# Patient Record
Sex: Female | Born: 1945 | Race: Black or African American | Hispanic: No | Marital: Single | Smoking: Never smoker
Health system: Southern US, Community
[De-identification: ages and names within clinical notes are randomized; demographics above are authoritative.]

## PROBLEM LIST (undated history)

## (undated) DIAGNOSIS — E119 Type 2 diabetes mellitus without complications: Secondary | ICD-10-CM

## (undated) DIAGNOSIS — E7800 Pure hypercholesterolemia, unspecified: Secondary | ICD-10-CM

## (undated) DIAGNOSIS — I10 Essential (primary) hypertension: Secondary | ICD-10-CM

## (undated) HISTORY — DX: Pure hypercholesterolemia, unspecified: E78.00

## (undated) HISTORY — DX: Type 2 diabetes mellitus without complications: E11.9

## (undated) HISTORY — DX: Essential (primary) hypertension: I10

---

## 2008-02-28 ENCOUNTER — Other Ambulatory Visit: Admission: RE | Admit: 2008-02-28 | Discharge: 2008-02-28 | Payer: Self-pay | Admitting: Family Medicine

## 2010-09-08 ENCOUNTER — Other Ambulatory Visit
Admission: RE | Admit: 2010-09-08 | Discharge: 2010-09-08 | Payer: Self-pay | Source: Home / Self Care | Admitting: Family Medicine

## 2011-03-02 ENCOUNTER — Emergency Department (HOSPITAL_COMMUNITY)
Admission: EM | Admit: 2011-03-02 | Discharge: 2011-03-02 | Disposition: A | Discharge status: Home / Self Care | Payer: BC Managed Care – PPO | Attending: Emergency Medicine | Admitting: Emergency Medicine

## 2011-03-02 ENCOUNTER — Emergency Department (HOSPITAL_COMMUNITY): Payer: Self-pay

## 2011-03-02 DIAGNOSIS — J189 Pneumonia, unspecified organism: Secondary | ICD-10-CM | POA: Insufficient documentation

## 2011-03-02 DIAGNOSIS — E7800 Pure hypercholesterolemia, unspecified: Secondary | ICD-10-CM | POA: Insufficient documentation

## 2011-03-02 DIAGNOSIS — R05 Cough: Secondary | ICD-10-CM | POA: Insufficient documentation

## 2011-03-02 DIAGNOSIS — I10 Essential (primary) hypertension: Secondary | ICD-10-CM | POA: Insufficient documentation

## 2011-03-02 DIAGNOSIS — Z79899 Other long term (current) drug therapy: Secondary | ICD-10-CM | POA: Insufficient documentation

## 2011-03-02 DIAGNOSIS — R059 Cough, unspecified: Secondary | ICD-10-CM | POA: Insufficient documentation

## 2011-03-02 DIAGNOSIS — IMO0001 Reserved for inherently not codable concepts without codable children: Secondary | ICD-10-CM | POA: Insufficient documentation

## 2011-09-12 ENCOUNTER — Other Ambulatory Visit (HOSPITAL_COMMUNITY)
Admission: RE | Admit: 2011-09-12 | Discharge: 2011-09-12 | Disposition: A | Discharge status: Home / Self Care | Payer: 59 | Source: Ambulatory Visit | Attending: Family Medicine | Admitting: Family Medicine

## 2011-09-12 DIAGNOSIS — Z124 Encounter for screening for malignant neoplasm of cervix: Secondary | ICD-10-CM | POA: Insufficient documentation

## 2013-11-05 ENCOUNTER — Other Ambulatory Visit: Payer: Self-pay | Admitting: Family Medicine

## 2013-11-05 ENCOUNTER — Other Ambulatory Visit (HOSPITAL_COMMUNITY)
Admission: RE | Admit: 2013-11-05 | Discharge: 2013-11-05 | Disposition: A | Discharge status: Home / Self Care | Payer: 59 | Source: Ambulatory Visit | Attending: Family Medicine | Admitting: Family Medicine

## 2013-11-05 DIAGNOSIS — Z124 Encounter for screening for malignant neoplasm of cervix: Secondary | ICD-10-CM | POA: Insufficient documentation

## 2014-11-10 ENCOUNTER — Other Ambulatory Visit (HOSPITAL_COMMUNITY)
Admission: RE | Admit: 2014-11-10 | Discharge: 2014-11-10 | Disposition: A | Discharge status: Home / Self Care | Payer: 59 | Source: Ambulatory Visit | Attending: Family Medicine | Admitting: Family Medicine

## 2014-11-10 ENCOUNTER — Other Ambulatory Visit: Payer: Self-pay | Admitting: Family Medicine

## 2014-11-10 DIAGNOSIS — Z124 Encounter for screening for malignant neoplasm of cervix: Secondary | ICD-10-CM | POA: Insufficient documentation

## 2014-11-12 LAB — CYTOLOGY - PAP

## 2014-11-17 ENCOUNTER — Other Ambulatory Visit: Payer: Self-pay | Admitting: Family Medicine

## 2014-11-17 DIAGNOSIS — Z1231 Encounter for screening mammogram for malignant neoplasm of breast: Secondary | ICD-10-CM

## 2014-11-17 DIAGNOSIS — E2839 Other primary ovarian failure: Secondary | ICD-10-CM

## 2014-11-25 ENCOUNTER — Ambulatory Visit
Admission: RE | Admit: 2014-11-25 | Discharge: 2014-11-25 | Disposition: A | Discharge status: Home / Self Care | Payer: 59 | Source: Ambulatory Visit | Attending: Family Medicine | Admitting: Family Medicine

## 2014-11-25 DIAGNOSIS — Z1231 Encounter for screening mammogram for malignant neoplasm of breast: Secondary | ICD-10-CM

## 2017-08-04 ENCOUNTER — Encounter: Payer: Self-pay | Admitting: Family Medicine

## 2017-11-02 ENCOUNTER — Encounter: Payer: Self-pay | Admitting: Family Medicine

## 2018-03-20 ENCOUNTER — Encounter: Payer: Self-pay | Admitting: Family Medicine

## 2018-03-20 DIAGNOSIS — F320 Major depressive disorder, single episode, mild: Secondary | ICD-10-CM | POA: Diagnosis not present

## 2018-03-20 DIAGNOSIS — Z1211 Encounter for screening for malignant neoplasm of colon: Secondary | ICD-10-CM | POA: Diagnosis not present

## 2018-03-20 DIAGNOSIS — E1122 Type 2 diabetes mellitus with diabetic chronic kidney disease: Secondary | ICD-10-CM | POA: Diagnosis not present

## 2018-03-20 DIAGNOSIS — N183 Chronic kidney disease, stage 3 (moderate): Secondary | ICD-10-CM | POA: Diagnosis not present

## 2018-03-20 DIAGNOSIS — Z0000 Encounter for general adult medical examination without abnormal findings: Secondary | ICD-10-CM | POA: Diagnosis not present

## 2018-03-20 DIAGNOSIS — G4700 Insomnia, unspecified: Secondary | ICD-10-CM | POA: Diagnosis not present

## 2018-03-20 DIAGNOSIS — E559 Vitamin D deficiency, unspecified: Secondary | ICD-10-CM | POA: Diagnosis not present

## 2018-05-24 DIAGNOSIS — N183 Chronic kidney disease, stage 3 (moderate): Secondary | ICD-10-CM | POA: Diagnosis not present

## 2018-05-24 DIAGNOSIS — I129 Hypertensive chronic kidney disease with stage 1 through stage 4 chronic kidney disease, or unspecified chronic kidney disease: Secondary | ICD-10-CM | POA: Diagnosis not present

## 2018-05-24 DIAGNOSIS — G479 Sleep disorder, unspecified: Secondary | ICD-10-CM | POA: Diagnosis not present

## 2018-05-24 DIAGNOSIS — E1122 Type 2 diabetes mellitus with diabetic chronic kidney disease: Secondary | ICD-10-CM | POA: Diagnosis not present

## 2018-05-24 DIAGNOSIS — M25511 Pain in right shoulder: Secondary | ICD-10-CM | POA: Diagnosis not present

## 2018-05-24 DIAGNOSIS — E559 Vitamin D deficiency, unspecified: Secondary | ICD-10-CM | POA: Diagnosis not present

## 2018-05-24 DIAGNOSIS — R42 Dizziness and giddiness: Secondary | ICD-10-CM | POA: Diagnosis not present

## 2018-05-24 DIAGNOSIS — Z23 Encounter for immunization: Secondary | ICD-10-CM | POA: Diagnosis not present

## 2018-06-13 ENCOUNTER — Encounter: Payer: Medicare HMO | Attending: Family Medicine | Admitting: *Deleted

## 2018-06-13 DIAGNOSIS — IMO0002 Reserved for concepts with insufficient information to code with codable children: Secondary | ICD-10-CM

## 2018-06-13 DIAGNOSIS — Z713 Dietary counseling and surveillance: Secondary | ICD-10-CM | POA: Insufficient documentation

## 2018-06-13 DIAGNOSIS — E118 Type 2 diabetes mellitus with unspecified complications: Secondary | ICD-10-CM

## 2018-06-13 DIAGNOSIS — E1165 Type 2 diabetes mellitus with hyperglycemia: Secondary | ICD-10-CM

## 2018-06-13 DIAGNOSIS — E1122 Type 2 diabetes mellitus with diabetic chronic kidney disease: Secondary | ICD-10-CM | POA: Diagnosis not present

## 2018-06-13 NOTE — Patient Instructions (Signed)
Plan:  Aim for 3 Carb Choices per meal (45 grams) +/- 1 either way  Aim for 0-2 Carbs per snack if hungry  Include protein in moderation with your meals and snacks Consider reading food labels for Total Carbohydrate of foods Consider  increasing your activity level by walking for 10-15 minutes daily as tolerated Consider checking BG at alternate times per day especially after exercise to see it's effect on your BG Constinue taking medication as directed by MD

## 2018-06-14 NOTE — Progress Notes (Signed)
Diabetes Self-Management Education  Visit Type: First/Initial  Appt. Start Time: 1530 Appt. End Time: 1700  06/14/2018  [name], identified by name and date of birth, is a 72 y.o. female with a diagnosis of Diabetes: Type 2. 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 She states she lives with her sister and brother, her brother does most of the cooking for the family. She gets exercise walking some of her patients on certain days of the week. She expresses frustration that she is often told what NOT to eat, with little suggestion of what she should eat.   ASSESSMENT  Height 5\' 2"  (1.575 m), weight 203 lb 8 oz (92.3 kg). Body mass index is 37.22 kg/m.  Diabetes Self-Management Education - 06/13/18 1547      Dietary Intake   Breakfast  McMuffin with sausage egg and cheese OR bagel with cream cheese or butter OR bacon and eggs at home and banana (with most breakfast meals)  (Pended)     Snack (morning)  PNB cracker  (Pended)     Lunch  bananan nut muffin if working OR hot meal if working where they have a cafe; chicken and vegetables  (Pended)     Snack (afternoon)  PNB sandwich  (Pended)     Dinner  picks up chicken with bread on way home from work or family cooks   (Pended)     Snack (evening)  PNB sandwich OR fruit cup OR applesauce  (Pended)     Beverage(s)  1/2 and 1/2 tea, hot tea,   (Pended)        Individualized Plan for Diabetes Self-Management Training:   Learning Objective:  Patient will have a greater understanding of diabetes self-management. Patient education plan is to attend individual and/or group sessions per assessed needs and concerns.   Plan:   Patient Instructions  Plan:  Aim for 3 Carb Choices per meal (45 grams) +/- 1 either way  Aim for 0-2 Carbs per snack if hungry  Include protein in moderation with your meals and snacks Consider reading food  labels for Total Carbohydrate of foods Consider  increasing your activity level by walking for 10-15 minutes daily as tolerated Consider checking BG at alternate times per day especially after exercise to see it's effect on your BG Constinue taking medication as directed by MD  Expected Outcomes:  Demonstrated interest in learning. Expect positive outcomes  Education material provided: Food label handouts, A1C conversion sheet, Meal plan card and Carbohydrate counting sheet, Diabetes Medication handout  If problems or questions, patient to contact team via:  Phone  Future DSME appointment: PRN

## 2018-06-15 DIAGNOSIS — M7501 Adhesive capsulitis of right shoulder: Secondary | ICD-10-CM | POA: Diagnosis not present

## 2018-06-21 DIAGNOSIS — M7501 Adhesive capsulitis of right shoulder: Secondary | ICD-10-CM | POA: Diagnosis not present

## 2018-06-27 DIAGNOSIS — M7501 Adhesive capsulitis of right shoulder: Secondary | ICD-10-CM | POA: Diagnosis not present

## 2018-07-05 DIAGNOSIS — M7501 Adhesive capsulitis of right shoulder: Secondary | ICD-10-CM | POA: Diagnosis not present

## 2018-07-10 DIAGNOSIS — M7501 Adhesive capsulitis of right shoulder: Secondary | ICD-10-CM | POA: Diagnosis not present

## 2018-07-17 DIAGNOSIS — M7501 Adhesive capsulitis of right shoulder: Secondary | ICD-10-CM | POA: Diagnosis not present

## 2018-07-23 DIAGNOSIS — M7501 Adhesive capsulitis of right shoulder: Secondary | ICD-10-CM | POA: Diagnosis not present

## 2018-08-02 DIAGNOSIS — H524 Presbyopia: Secondary | ICD-10-CM | POA: Diagnosis not present

## 2018-09-04 DIAGNOSIS — I10 Essential (primary) hypertension: Secondary | ICD-10-CM | POA: Diagnosis not present

## 2018-09-04 DIAGNOSIS — J309 Allergic rhinitis, unspecified: Secondary | ICD-10-CM | POA: Diagnosis not present

## 2018-09-04 DIAGNOSIS — E1165 Type 2 diabetes mellitus with hyperglycemia: Secondary | ICD-10-CM | POA: Diagnosis not present

## 2018-12-20 DIAGNOSIS — G4700 Insomnia, unspecified: Secondary | ICD-10-CM | POA: Diagnosis not present

## 2018-12-20 DIAGNOSIS — E1122 Type 2 diabetes mellitus with diabetic chronic kidney disease: Secondary | ICD-10-CM | POA: Diagnosis not present

## 2018-12-20 DIAGNOSIS — N183 Chronic kidney disease, stage 3 (moderate): Secondary | ICD-10-CM | POA: Diagnosis not present

## 2019-01-29 DIAGNOSIS — E1165 Type 2 diabetes mellitus with hyperglycemia: Secondary | ICD-10-CM | POA: Diagnosis not present

## 2019-01-29 DIAGNOSIS — Z7984 Long term (current) use of oral hypoglycemic drugs: Secondary | ICD-10-CM | POA: Diagnosis not present

## 2019-01-29 DIAGNOSIS — E1122 Type 2 diabetes mellitus with diabetic chronic kidney disease: Secondary | ICD-10-CM | POA: Diagnosis not present

## 2019-02-11 DIAGNOSIS — Z20828 Contact with and (suspected) exposure to other viral communicable diseases: Secondary | ICD-10-CM | POA: Diagnosis not present

## 2019-05-17 DIAGNOSIS — N760 Acute vaginitis: Secondary | ICD-10-CM | POA: Diagnosis not present

## 2019-05-17 DIAGNOSIS — N183 Chronic kidney disease, stage 3 (moderate): Secondary | ICD-10-CM | POA: Diagnosis not present

## 2019-05-17 DIAGNOSIS — E785 Hyperlipidemia, unspecified: Secondary | ICD-10-CM | POA: Diagnosis not present

## 2019-05-17 DIAGNOSIS — I10 Essential (primary) hypertension: Secondary | ICD-10-CM | POA: Diagnosis not present

## 2019-05-17 DIAGNOSIS — Z0000 Encounter for general adult medical examination without abnormal findings: Secondary | ICD-10-CM | POA: Diagnosis not present

## 2019-05-17 DIAGNOSIS — Z79899 Other long term (current) drug therapy: Secondary | ICD-10-CM | POA: Diagnosis not present

## 2019-05-17 DIAGNOSIS — Z794 Long term (current) use of insulin: Secondary | ICD-10-CM | POA: Diagnosis not present

## 2019-05-17 DIAGNOSIS — Z23 Encounter for immunization: Secondary | ICD-10-CM | POA: Diagnosis not present

## 2019-05-17 DIAGNOSIS — E1122 Type 2 diabetes mellitus with diabetic chronic kidney disease: Secondary | ICD-10-CM | POA: Diagnosis not present

## 2019-05-17 DIAGNOSIS — Z1159 Encounter for screening for other viral diseases: Secondary | ICD-10-CM | POA: Diagnosis not present

## 2019-06-11 DIAGNOSIS — E1122 Type 2 diabetes mellitus with diabetic chronic kidney disease: Secondary | ICD-10-CM | POA: Diagnosis not present

## 2019-06-11 DIAGNOSIS — N183 Chronic kidney disease, stage 3 unspecified: Secondary | ICD-10-CM | POA: Diagnosis not present

## 2019-07-16 DIAGNOSIS — I10 Essential (primary) hypertension: Secondary | ICD-10-CM | POA: Diagnosis not present

## 2019-07-16 DIAGNOSIS — E1122 Type 2 diabetes mellitus with diabetic chronic kidney disease: Secondary | ICD-10-CM | POA: Diagnosis not present

## 2019-09-19 DIAGNOSIS — E1122 Type 2 diabetes mellitus with diabetic chronic kidney disease: Secondary | ICD-10-CM | POA: Diagnosis not present

## 2019-10-03 DIAGNOSIS — E785 Hyperlipidemia, unspecified: Secondary | ICD-10-CM | POA: Diagnosis not present

## 2019-10-03 DIAGNOSIS — R809 Proteinuria, unspecified: Secondary | ICD-10-CM | POA: Diagnosis not present

## 2019-10-03 DIAGNOSIS — I10 Essential (primary) hypertension: Secondary | ICD-10-CM | POA: Diagnosis not present

## 2019-10-03 DIAGNOSIS — Z7189 Other specified counseling: Secondary | ICD-10-CM | POA: Diagnosis not present

## 2019-10-03 DIAGNOSIS — Z6837 Body mass index (BMI) 37.0-37.9, adult: Secondary | ICD-10-CM | POA: Diagnosis not present

## 2019-10-03 DIAGNOSIS — E1165 Type 2 diabetes mellitus with hyperglycemia: Secondary | ICD-10-CM | POA: Diagnosis not present

## 2019-10-22 DIAGNOSIS — R809 Proteinuria, unspecified: Secondary | ICD-10-CM | POA: Diagnosis not present

## 2019-10-22 DIAGNOSIS — I10 Essential (primary) hypertension: Secondary | ICD-10-CM | POA: Diagnosis not present

## 2019-10-22 DIAGNOSIS — E1165 Type 2 diabetes mellitus with hyperglycemia: Secondary | ICD-10-CM | POA: Diagnosis not present

## 2019-10-22 DIAGNOSIS — Z7189 Other specified counseling: Secondary | ICD-10-CM | POA: Diagnosis not present

## 2019-10-22 DIAGNOSIS — Z6837 Body mass index (BMI) 37.0-37.9, adult: Secondary | ICD-10-CM | POA: Diagnosis not present

## 2019-10-22 DIAGNOSIS — E785 Hyperlipidemia, unspecified: Secondary | ICD-10-CM | POA: Diagnosis not present

## 2019-12-16 ENCOUNTER — Other Ambulatory Visit: Payer: Self-pay | Admitting: Family Medicine

## 2019-12-16 DIAGNOSIS — Z1231 Encounter for screening mammogram for malignant neoplasm of breast: Secondary | ICD-10-CM

## 2019-12-19 DIAGNOSIS — R809 Proteinuria, unspecified: Secondary | ICD-10-CM | POA: Diagnosis not present

## 2019-12-19 DIAGNOSIS — Z7189 Other specified counseling: Secondary | ICD-10-CM | POA: Diagnosis not present

## 2019-12-19 DIAGNOSIS — E785 Hyperlipidemia, unspecified: Secondary | ICD-10-CM | POA: Diagnosis not present

## 2019-12-19 DIAGNOSIS — E1165 Type 2 diabetes mellitus with hyperglycemia: Secondary | ICD-10-CM | POA: Diagnosis not present

## 2019-12-19 DIAGNOSIS — I10 Essential (primary) hypertension: Secondary | ICD-10-CM | POA: Diagnosis not present

## 2019-12-19 DIAGNOSIS — Z6837 Body mass index (BMI) 37.0-37.9, adult: Secondary | ICD-10-CM | POA: Diagnosis not present

## 2020-01-14 DIAGNOSIS — E559 Vitamin D deficiency, unspecified: Secondary | ICD-10-CM | POA: Diagnosis not present

## 2020-01-14 DIAGNOSIS — E1129 Type 2 diabetes mellitus with other diabetic kidney complication: Secondary | ICD-10-CM | POA: Diagnosis not present

## 2020-01-14 DIAGNOSIS — N1832 Chronic kidney disease, stage 3b: Secondary | ICD-10-CM | POA: Diagnosis not present

## 2020-01-14 DIAGNOSIS — E1122 Type 2 diabetes mellitus with diabetic chronic kidney disease: Secondary | ICD-10-CM | POA: Diagnosis not present

## 2020-01-14 DIAGNOSIS — I129 Hypertensive chronic kidney disease with stage 1 through stage 4 chronic kidney disease, or unspecified chronic kidney disease: Secondary | ICD-10-CM | POA: Diagnosis not present

## 2020-01-14 DIAGNOSIS — R809 Proteinuria, unspecified: Secondary | ICD-10-CM | POA: Diagnosis not present

## 2020-01-15 ENCOUNTER — Other Ambulatory Visit: Payer: Self-pay | Admitting: Nephrology

## 2020-01-15 DIAGNOSIS — N1832 Chronic kidney disease, stage 3b: Secondary | ICD-10-CM

## 2020-01-21 ENCOUNTER — Ambulatory Visit
Admission: RE | Admit: 2020-01-21 | Discharge: 2020-01-21 | Disposition: A | Discharge status: Home / Self Care | Payer: Medicare HMO | Source: Ambulatory Visit | Attending: Family Medicine | Admitting: Family Medicine

## 2020-01-21 ENCOUNTER — Other Ambulatory Visit: Payer: Self-pay

## 2020-01-21 DIAGNOSIS — Z1231 Encounter for screening mammogram for malignant neoplasm of breast: Secondary | ICD-10-CM | POA: Diagnosis not present

## 2020-01-28 ENCOUNTER — Ambulatory Visit
Admission: RE | Admit: 2020-01-28 | Discharge: 2020-01-28 | Disposition: A | Discharge status: Home / Self Care | Payer: Medicare HMO | Source: Ambulatory Visit | Attending: Nephrology | Admitting: Nephrology

## 2020-01-28 ENCOUNTER — Other Ambulatory Visit: Payer: Self-pay

## 2020-01-28 DIAGNOSIS — N1832 Chronic kidney disease, stage 3b: Secondary | ICD-10-CM

## 2020-01-28 DIAGNOSIS — N183 Chronic kidney disease, stage 3 unspecified: Secondary | ICD-10-CM | POA: Diagnosis not present

## 2020-01-28 DIAGNOSIS — N200 Calculus of kidney: Secondary | ICD-10-CM | POA: Diagnosis not present

## 2020-01-30 DIAGNOSIS — H524 Presbyopia: Secondary | ICD-10-CM | POA: Diagnosis not present

## 2020-02-13 DIAGNOSIS — E119 Type 2 diabetes mellitus without complications: Secondary | ICD-10-CM | POA: Diagnosis not present

## 2020-02-13 DIAGNOSIS — H43823 Vitreomacular adhesion, bilateral: Secondary | ICD-10-CM | POA: Diagnosis not present

## 2020-03-24 DIAGNOSIS — R809 Proteinuria, unspecified: Secondary | ICD-10-CM | POA: Diagnosis not present

## 2020-03-24 DIAGNOSIS — Z6837 Body mass index (BMI) 37.0-37.9, adult: Secondary | ICD-10-CM | POA: Diagnosis not present

## 2020-03-24 DIAGNOSIS — E1165 Type 2 diabetes mellitus with hyperglycemia: Secondary | ICD-10-CM | POA: Diagnosis not present

## 2020-03-24 DIAGNOSIS — E785 Hyperlipidemia, unspecified: Secondary | ICD-10-CM | POA: Diagnosis not present

## 2020-03-24 DIAGNOSIS — I10 Essential (primary) hypertension: Secondary | ICD-10-CM | POA: Diagnosis not present

## 2020-04-27 DIAGNOSIS — N1832 Chronic kidney disease, stage 3b: Secondary | ICD-10-CM | POA: Diagnosis not present

## 2020-05-05 DIAGNOSIS — R809 Proteinuria, unspecified: Secondary | ICD-10-CM | POA: Diagnosis not present

## 2020-05-05 DIAGNOSIS — Z905 Acquired absence of kidney: Secondary | ICD-10-CM | POA: Diagnosis not present

## 2020-05-05 DIAGNOSIS — E1122 Type 2 diabetes mellitus with diabetic chronic kidney disease: Secondary | ICD-10-CM | POA: Diagnosis not present

## 2020-05-05 DIAGNOSIS — N200 Calculus of kidney: Secondary | ICD-10-CM | POA: Diagnosis not present

## 2020-05-05 DIAGNOSIS — N1831 Chronic kidney disease, stage 3a: Secondary | ICD-10-CM | POA: Diagnosis not present

## 2020-05-05 DIAGNOSIS — I129 Hypertensive chronic kidney disease with stage 1 through stage 4 chronic kidney disease, or unspecified chronic kidney disease: Secondary | ICD-10-CM | POA: Diagnosis not present

## 2020-05-05 DIAGNOSIS — E1129 Type 2 diabetes mellitus with other diabetic kidney complication: Secondary | ICD-10-CM | POA: Diagnosis not present

## 2020-06-10 DIAGNOSIS — I10 Essential (primary) hypertension: Secondary | ICD-10-CM | POA: Diagnosis not present

## 2020-06-10 DIAGNOSIS — N1831 Chronic kidney disease, stage 3a: Secondary | ICD-10-CM | POA: Diagnosis not present

## 2020-06-10 DIAGNOSIS — Z0000 Encounter for general adult medical examination without abnormal findings: Secondary | ICD-10-CM | POA: Diagnosis not present

## 2020-06-10 DIAGNOSIS — K219 Gastro-esophageal reflux disease without esophagitis: Secondary | ICD-10-CM | POA: Diagnosis not present

## 2020-06-10 DIAGNOSIS — Z23 Encounter for immunization: Secondary | ICD-10-CM | POA: Diagnosis not present

## 2020-06-10 DIAGNOSIS — E785 Hyperlipidemia, unspecified: Secondary | ICD-10-CM | POA: Diagnosis not present

## 2020-06-10 DIAGNOSIS — E1122 Type 2 diabetes mellitus with diabetic chronic kidney disease: Secondary | ICD-10-CM | POA: Diagnosis not present

## 2020-06-10 DIAGNOSIS — Z79899 Other long term (current) drug therapy: Secondary | ICD-10-CM | POA: Diagnosis not present

## 2020-06-10 DIAGNOSIS — E559 Vitamin D deficiency, unspecified: Secondary | ICD-10-CM | POA: Diagnosis not present

## 2020-06-24 DIAGNOSIS — I10 Essential (primary) hypertension: Secondary | ICD-10-CM | POA: Diagnosis not present

## 2020-06-24 DIAGNOSIS — R809 Proteinuria, unspecified: Secondary | ICD-10-CM | POA: Diagnosis not present

## 2020-06-24 DIAGNOSIS — E1165 Type 2 diabetes mellitus with hyperglycemia: Secondary | ICD-10-CM | POA: Diagnosis not present

## 2020-06-24 DIAGNOSIS — E785 Hyperlipidemia, unspecified: Secondary | ICD-10-CM | POA: Diagnosis not present

## 2020-06-24 DIAGNOSIS — Z6837 Body mass index (BMI) 37.0-37.9, adult: Secondary | ICD-10-CM | POA: Diagnosis not present

## 2020-07-07 DIAGNOSIS — N200 Calculus of kidney: Secondary | ICD-10-CM | POA: Diagnosis not present

## 2020-09-03 DIAGNOSIS — Z03818 Encounter for observation for suspected exposure to other biological agents ruled out: Secondary | ICD-10-CM | POA: Diagnosis not present

## 2020-09-03 DIAGNOSIS — R051 Acute cough: Secondary | ICD-10-CM | POA: Diagnosis not present

## 2020-09-03 DIAGNOSIS — J069 Acute upper respiratory infection, unspecified: Secondary | ICD-10-CM | POA: Diagnosis not present

## 2020-10-26 DIAGNOSIS — N1831 Chronic kidney disease, stage 3a: Secondary | ICD-10-CM | POA: Diagnosis not present

## 2020-11-05 DIAGNOSIS — Z905 Acquired absence of kidney: Secondary | ICD-10-CM | POA: Diagnosis not present

## 2020-11-05 DIAGNOSIS — R809 Proteinuria, unspecified: Secondary | ICD-10-CM | POA: Diagnosis not present

## 2020-11-05 DIAGNOSIS — E1129 Type 2 diabetes mellitus with other diabetic kidney complication: Secondary | ICD-10-CM | POA: Diagnosis not present

## 2020-11-05 DIAGNOSIS — N200 Calculus of kidney: Secondary | ICD-10-CM | POA: Diagnosis not present

## 2020-11-05 DIAGNOSIS — N1832 Chronic kidney disease, stage 3b: Secondary | ICD-10-CM | POA: Diagnosis not present

## 2020-11-05 DIAGNOSIS — E1122 Type 2 diabetes mellitus with diabetic chronic kidney disease: Secondary | ICD-10-CM | POA: Diagnosis not present

## 2020-11-05 DIAGNOSIS — I129 Hypertensive chronic kidney disease with stage 1 through stage 4 chronic kidney disease, or unspecified chronic kidney disease: Secondary | ICD-10-CM | POA: Diagnosis not present

## 2020-12-16 DIAGNOSIS — E785 Hyperlipidemia, unspecified: Secondary | ICD-10-CM | POA: Diagnosis not present

## 2020-12-16 DIAGNOSIS — E1165 Type 2 diabetes mellitus with hyperglycemia: Secondary | ICD-10-CM | POA: Diagnosis not present

## 2020-12-23 DIAGNOSIS — E1165 Type 2 diabetes mellitus with hyperglycemia: Secondary | ICD-10-CM | POA: Diagnosis not present

## 2020-12-23 DIAGNOSIS — E785 Hyperlipidemia, unspecified: Secondary | ICD-10-CM | POA: Diagnosis not present

## 2020-12-23 DIAGNOSIS — I10 Essential (primary) hypertension: Secondary | ICD-10-CM | POA: Diagnosis not present

## 2020-12-23 DIAGNOSIS — R809 Proteinuria, unspecified: Secondary | ICD-10-CM | POA: Diagnosis not present

## 2020-12-23 DIAGNOSIS — Z6836 Body mass index (BMI) 36.0-36.9, adult: Secondary | ICD-10-CM | POA: Diagnosis not present

## 2021-02-25 ENCOUNTER — Other Ambulatory Visit: Payer: Self-pay | Admitting: Family Medicine

## 2021-02-25 DIAGNOSIS — Z1231 Encounter for screening mammogram for malignant neoplasm of breast: Secondary | ICD-10-CM

## 2021-03-17 DIAGNOSIS — I10 Essential (primary) hypertension: Secondary | ICD-10-CM | POA: Diagnosis not present

## 2021-03-17 DIAGNOSIS — E785 Hyperlipidemia, unspecified: Secondary | ICD-10-CM | POA: Diagnosis not present

## 2021-03-17 DIAGNOSIS — E1165 Type 2 diabetes mellitus with hyperglycemia: Secondary | ICD-10-CM | POA: Diagnosis not present

## 2021-03-24 DIAGNOSIS — E785 Hyperlipidemia, unspecified: Secondary | ICD-10-CM | POA: Diagnosis not present

## 2021-03-24 DIAGNOSIS — I10 Essential (primary) hypertension: Secondary | ICD-10-CM | POA: Diagnosis not present

## 2021-03-24 DIAGNOSIS — E1165 Type 2 diabetes mellitus with hyperglycemia: Secondary | ICD-10-CM | POA: Diagnosis not present

## 2021-03-24 DIAGNOSIS — Z6836 Body mass index (BMI) 36.0-36.9, adult: Secondary | ICD-10-CM | POA: Diagnosis not present

## 2021-04-09 DIAGNOSIS — H524 Presbyopia: Secondary | ICD-10-CM | POA: Diagnosis not present

## 2021-04-21 ENCOUNTER — Other Ambulatory Visit: Payer: Self-pay

## 2021-04-21 ENCOUNTER — Ambulatory Visit
Admission: RE | Admit: 2021-04-21 | Discharge: 2021-04-21 | Disposition: A | Discharge status: Home / Self Care | Payer: Medicare HMO | Source: Ambulatory Visit | Attending: Family Medicine | Admitting: Family Medicine

## 2021-04-21 DIAGNOSIS — Z1231 Encounter for screening mammogram for malignant neoplasm of breast: Secondary | ICD-10-CM | POA: Diagnosis not present

## 2021-04-26 DIAGNOSIS — N1832 Chronic kidney disease, stage 3b: Secondary | ICD-10-CM | POA: Diagnosis not present

## 2021-05-04 DIAGNOSIS — R809 Proteinuria, unspecified: Secondary | ICD-10-CM | POA: Diagnosis not present

## 2021-05-04 DIAGNOSIS — N200 Calculus of kidney: Secondary | ICD-10-CM | POA: Diagnosis not present

## 2021-05-04 DIAGNOSIS — I129 Hypertensive chronic kidney disease with stage 1 through stage 4 chronic kidney disease, or unspecified chronic kidney disease: Secondary | ICD-10-CM | POA: Diagnosis not present

## 2021-05-04 DIAGNOSIS — N2581 Secondary hyperparathyroidism of renal origin: Secondary | ICD-10-CM | POA: Diagnosis not present

## 2021-05-04 DIAGNOSIS — Z905 Acquired absence of kidney: Secondary | ICD-10-CM | POA: Diagnosis not present

## 2021-05-04 DIAGNOSIS — E1129 Type 2 diabetes mellitus with other diabetic kidney complication: Secondary | ICD-10-CM | POA: Diagnosis not present

## 2021-05-04 DIAGNOSIS — E1122 Type 2 diabetes mellitus with diabetic chronic kidney disease: Secondary | ICD-10-CM | POA: Diagnosis not present

## 2021-05-04 DIAGNOSIS — N1832 Chronic kidney disease, stage 3b: Secondary | ICD-10-CM | POA: Diagnosis not present

## 2021-05-22 IMAGING — MG DIGITAL SCREENING BILAT W/ TOMO W/ CAD
6 of 12 series · 6 of 36 positions shown · non-contrast
Comparison: Previous exam(s).

CLINICAL DATA: Screening.

EXAM:
DIGITAL SCREENING BILATERAL MAMMOGRAM WITH TOMO AND CAD

[R MLO synth-2D]
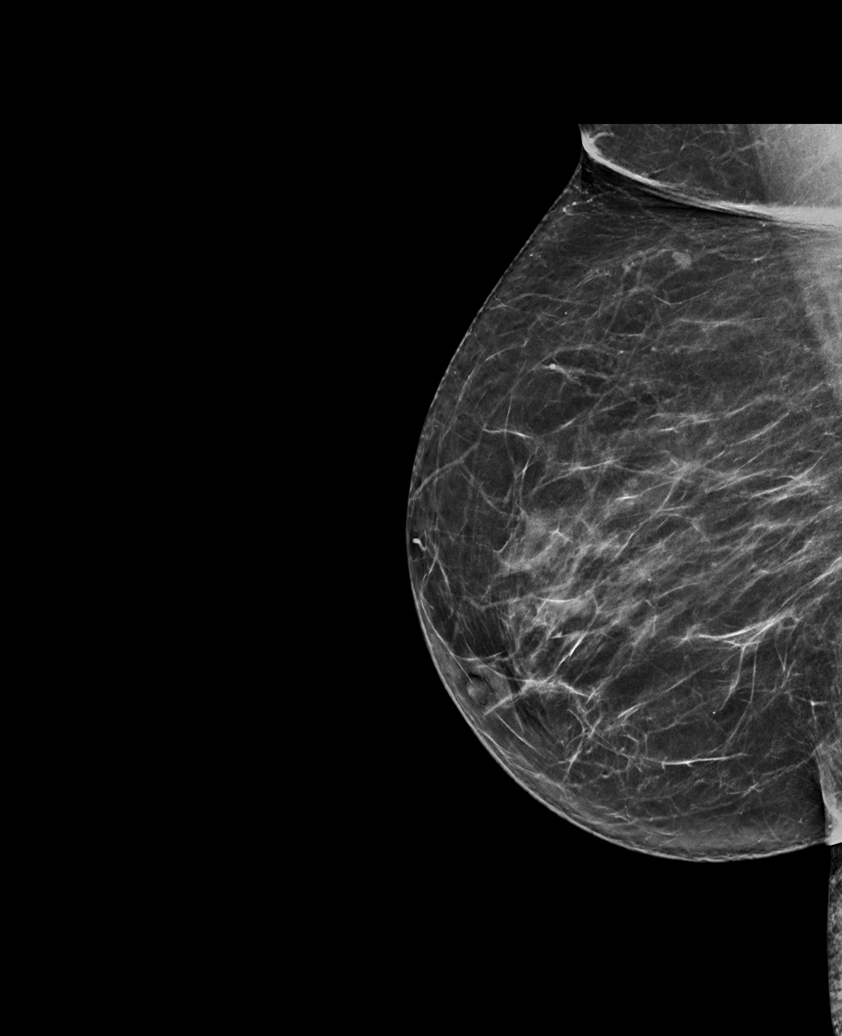

[L CC synth-2D (1 of 2)]
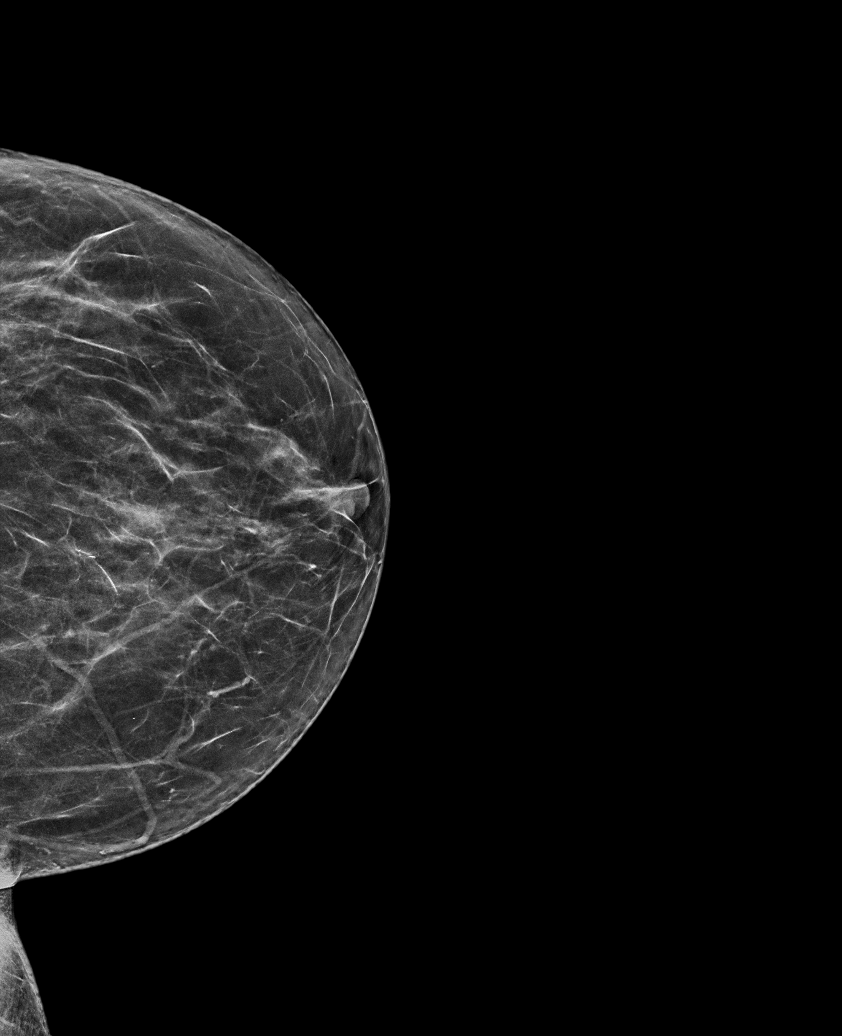

[L CC synth-2D (2 of 2)]
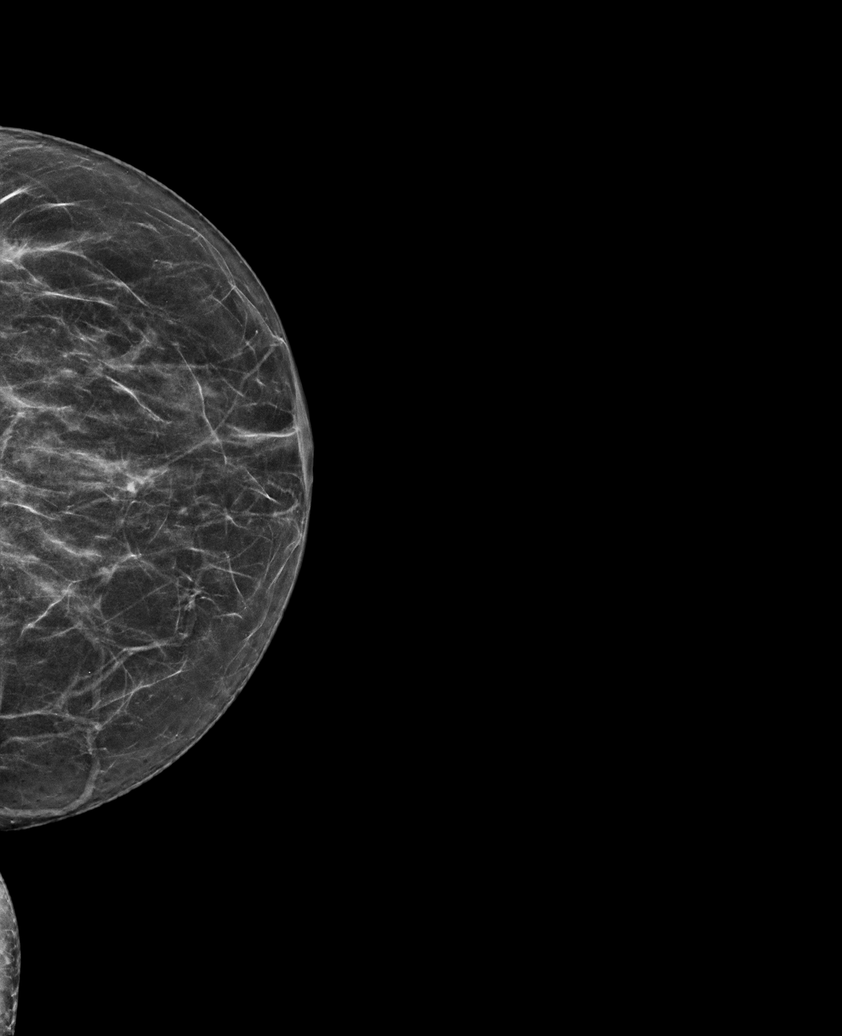

[R CC synth-2D (1 of 2)]
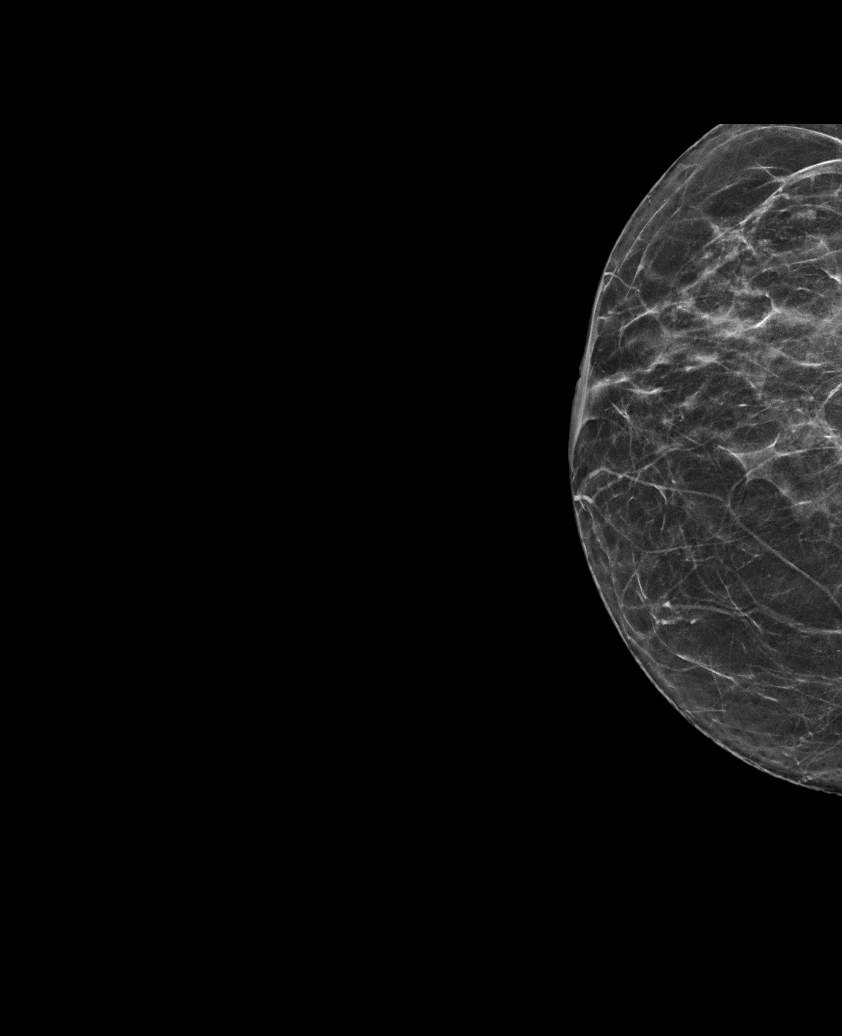

[L MLO synth-2D]
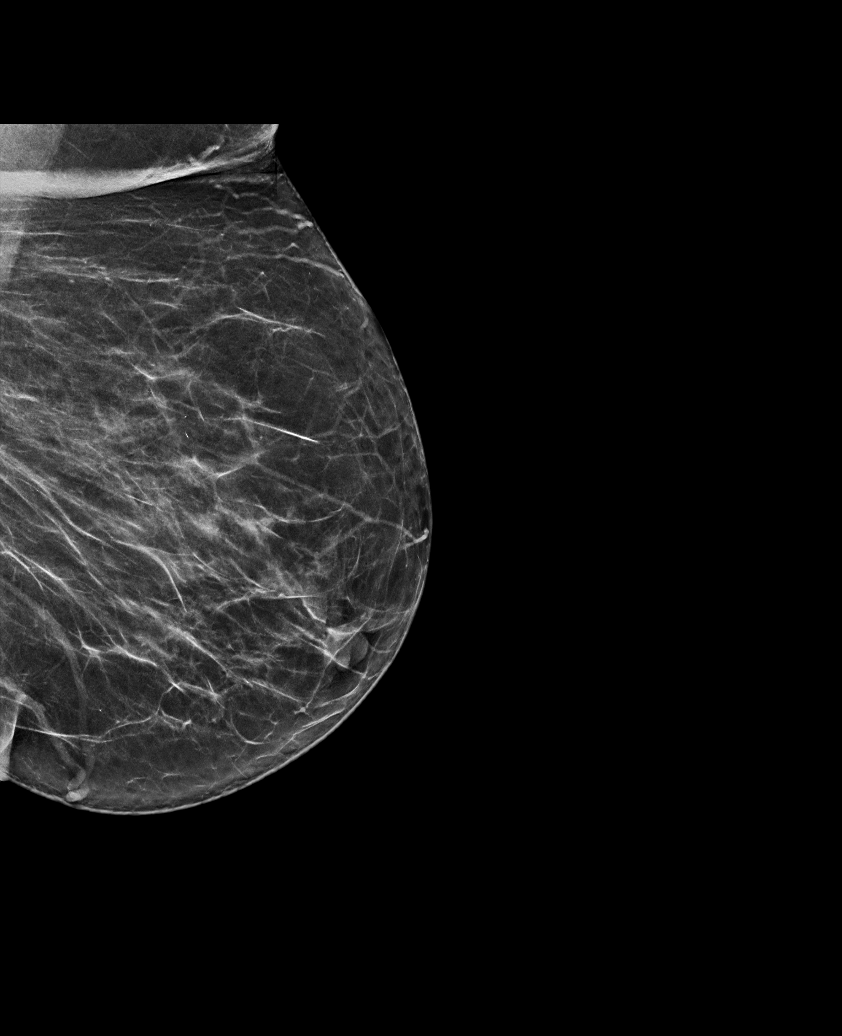

[R CC synth-2D (2 of 2)]
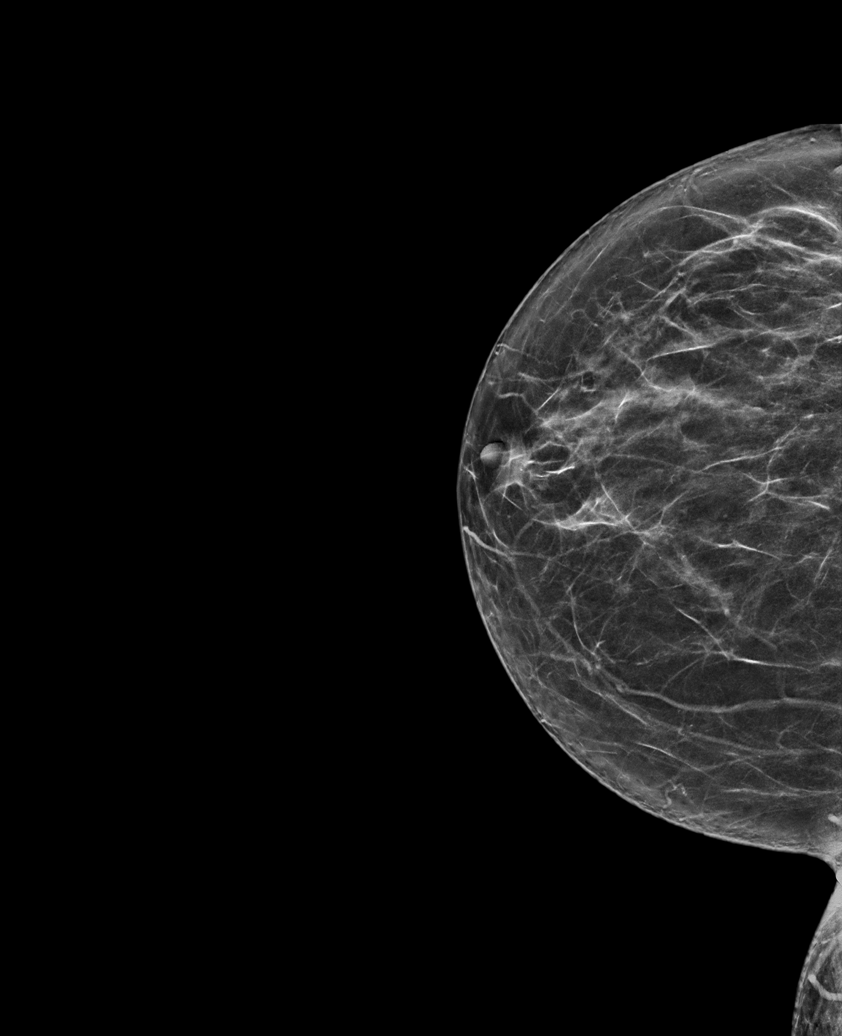

[6 of 36 positions shown; findings below may reference images not displayed]

ACR Breast Density Category b: There are scattered areas of
fibroglandular density.
FINDINGS: There are no findings suspicious for malignancy. Images were
processed with CAD.
IMPRESSION: No mammographic evidence of malignancy. A result letter of this
screening mammogram will be mailed directly to the patient.

RECOMMENDATION:
Screening mammogram in one year. (Code:[id number])

BI-RADS CATEGORY  1: Negative.

## 2021-05-25 DIAGNOSIS — H43822 Vitreomacular adhesion, left eye: Secondary | ICD-10-CM | POA: Diagnosis not present

## 2021-05-25 DIAGNOSIS — E119 Type 2 diabetes mellitus without complications: Secondary | ICD-10-CM | POA: Diagnosis not present

## 2021-05-27 DIAGNOSIS — Z23 Encounter for immunization: Secondary | ICD-10-CM | POA: Diagnosis not present

## 2021-06-24 DIAGNOSIS — K219 Gastro-esophageal reflux disease without esophagitis: Secondary | ICD-10-CM | POA: Diagnosis not present

## 2021-06-24 DIAGNOSIS — E559 Vitamin D deficiency, unspecified: Secondary | ICD-10-CM | POA: Diagnosis not present

## 2021-06-24 DIAGNOSIS — E1122 Type 2 diabetes mellitus with diabetic chronic kidney disease: Secondary | ICD-10-CM | POA: Diagnosis not present

## 2021-06-24 DIAGNOSIS — Z0000 Encounter for general adult medical examination without abnormal findings: Secondary | ICD-10-CM | POA: Diagnosis not present

## 2021-06-24 DIAGNOSIS — Z79899 Other long term (current) drug therapy: Secondary | ICD-10-CM | POA: Diagnosis not present

## 2021-06-24 DIAGNOSIS — E2839 Other primary ovarian failure: Secondary | ICD-10-CM | POA: Diagnosis not present

## 2021-06-24 DIAGNOSIS — I129 Hypertensive chronic kidney disease with stage 1 through stage 4 chronic kidney disease, or unspecified chronic kidney disease: Secondary | ICD-10-CM | POA: Diagnosis not present

## 2021-06-24 DIAGNOSIS — I10 Essential (primary) hypertension: Secondary | ICD-10-CM | POA: Diagnosis not present

## 2021-06-24 DIAGNOSIS — F320 Major depressive disorder, single episode, mild: Secondary | ICD-10-CM | POA: Diagnosis not present

## 2021-06-24 DIAGNOSIS — E785 Hyperlipidemia, unspecified: Secondary | ICD-10-CM | POA: Diagnosis not present

## 2021-06-30 DIAGNOSIS — C189 Malignant neoplasm of colon, unspecified: Secondary | ICD-10-CM | POA: Diagnosis not present

## 2021-07-15 DIAGNOSIS — R051 Acute cough: Secondary | ICD-10-CM | POA: Diagnosis not present

## 2021-07-15 DIAGNOSIS — Z03818 Encounter for observation for suspected exposure to other biological agents ruled out: Secondary | ICD-10-CM | POA: Diagnosis not present

## 2021-08-05 ENCOUNTER — Other Ambulatory Visit: Payer: Self-pay

## 2021-08-05 ENCOUNTER — Ambulatory Visit
Admission: RE | Admit: 2021-08-05 | Discharge: 2021-08-05 | Disposition: A | Discharge status: Home / Self Care | Payer: Medicare HMO | Source: Ambulatory Visit | Attending: Family Medicine | Admitting: Family Medicine

## 2021-08-05 ENCOUNTER — Other Ambulatory Visit: Payer: Self-pay | Admitting: Family Medicine

## 2021-08-05 DIAGNOSIS — R058 Other specified cough: Secondary | ICD-10-CM

## 2021-08-05 DIAGNOSIS — R059 Cough, unspecified: Secondary | ICD-10-CM | POA: Diagnosis not present

## 2021-08-06 DIAGNOSIS — J00 Acute nasopharyngitis [common cold]: Secondary | ICD-10-CM | POA: Diagnosis not present

## 2021-08-06 DIAGNOSIS — E2839 Other primary ovarian failure: Secondary | ICD-10-CM | POA: Diagnosis not present

## 2021-08-10 ENCOUNTER — Other Ambulatory Visit: Payer: Self-pay | Admitting: Family Medicine

## 2021-08-10 DIAGNOSIS — E2839 Other primary ovarian failure: Secondary | ICD-10-CM

## 2021-11-15 DIAGNOSIS — N1832 Chronic kidney disease, stage 3b: Secondary | ICD-10-CM | POA: Diagnosis not present

## 2021-11-22 DIAGNOSIS — N200 Calculus of kidney: Secondary | ICD-10-CM | POA: Diagnosis not present

## 2021-11-22 DIAGNOSIS — N1832 Chronic kidney disease, stage 3b: Secondary | ICD-10-CM | POA: Diagnosis not present

## 2021-11-22 DIAGNOSIS — I129 Hypertensive chronic kidney disease with stage 1 through stage 4 chronic kidney disease, or unspecified chronic kidney disease: Secondary | ICD-10-CM | POA: Diagnosis not present

## 2021-11-22 DIAGNOSIS — E1122 Type 2 diabetes mellitus with diabetic chronic kidney disease: Secondary | ICD-10-CM | POA: Diagnosis not present

## 2021-11-22 DIAGNOSIS — R809 Proteinuria, unspecified: Secondary | ICD-10-CM | POA: Diagnosis not present

## 2021-11-22 DIAGNOSIS — N2581 Secondary hyperparathyroidism of renal origin: Secondary | ICD-10-CM | POA: Diagnosis not present

## 2021-11-22 DIAGNOSIS — Z905 Acquired absence of kidney: Secondary | ICD-10-CM | POA: Diagnosis not present

## 2021-11-22 DIAGNOSIS — E1129 Type 2 diabetes mellitus with other diabetic kidney complication: Secondary | ICD-10-CM | POA: Diagnosis not present

## 2021-12-24 DIAGNOSIS — N2581 Secondary hyperparathyroidism of renal origin: Secondary | ICD-10-CM | POA: Diagnosis not present

## 2021-12-24 DIAGNOSIS — I10 Essential (primary) hypertension: Secondary | ICD-10-CM | POA: Diagnosis not present

## 2021-12-24 DIAGNOSIS — E1122 Type 2 diabetes mellitus with diabetic chronic kidney disease: Secondary | ICD-10-CM | POA: Diagnosis not present

## 2022-02-03 ENCOUNTER — Inpatient Hospital Stay: Admission: RE | Admit: 2022-02-03 | Payer: Medicare HMO | Source: Ambulatory Visit

## 2022-05-20 DIAGNOSIS — Z23 Encounter for immunization: Secondary | ICD-10-CM | POA: Diagnosis not present

## 2022-05-26 ENCOUNTER — Other Ambulatory Visit: Payer: Self-pay | Admitting: Family Medicine

## 2022-05-26 DIAGNOSIS — Z1231 Encounter for screening mammogram for malignant neoplasm of breast: Secondary | ICD-10-CM

## 2022-05-27 ENCOUNTER — Ambulatory Visit
Admission: EM | Admit: 2022-05-27 | Discharge: 2022-05-27 | Disposition: A | Discharge status: Home / Self Care | Payer: Medicare HMO

## 2022-05-27 DIAGNOSIS — R053 Chronic cough: Secondary | ICD-10-CM | POA: Diagnosis not present

## 2022-05-27 DIAGNOSIS — J209 Acute bronchitis, unspecified: Secondary | ICD-10-CM

## 2022-05-27 DIAGNOSIS — E119 Type 2 diabetes mellitus without complications: Secondary | ICD-10-CM | POA: Diagnosis not present

## 2022-05-27 HISTORY — DX: Type 2 diabetes mellitus without complications: E11.9

## 2022-05-27 HISTORY — DX: Pure hypercholesterolemia, unspecified: E78.00

## 2022-05-27 HISTORY — DX: Essential (primary) hypertension: I10

## 2022-05-27 MED ORDER — PROMETHAZINE-DM 6.25-15 MG/5ML PO SYRP: 2.5000 mL | ORAL_SOLUTION | Freq: Three times a day (TID) | ORAL | 0 refills | Status: AC | PRN

## 2022-05-27 MED ORDER — AZITHROMYCIN 250 MG PO TABS: ORAL_TABLET | ORAL | 0 refills | Status: AC

## 2022-05-27 MED ORDER — PREDNISONE 10 MG PO TABS: 30.0000 mg | ORAL_TABLET | Freq: Every day | ORAL | 0 refills | Status: AC

## 2022-05-27 NOTE — ED Provider Notes (Signed)
[facility]  Note:  This document was prepared using Systems analyst and may include unintentional dictation errors.  MRN: [id number] DOB: Oct 05, 1945  Subjective:   Crystal Christensen is a 76 y.o. female presenting for 2 week history of persistent dry cough. No fever, chest pain, shob, wheezing, sinus drainage, congestion. No smoking or asthma. Has a history of bronchitis.  Has type 2 diabetes treated without insulin.  Blood sugars are in the 140s to 150s.  No current facility-administered medications for this encounter.  Current Outpatient Medications:    amLODipine (NORVASC) 10 MG tablet, Take 10 mg by mouth daily., Disp: , Rfl:    atorvastatin (LIPITOR) 40 MG tablet, Take 40 mg by mouth daily., Disp: , Rfl:    FARXIGA 10 MG TABS tablet, Take 10 mg by mouth daily., Disp: , Rfl:    losartan (COZAAR) 100 MG tablet, Take 100 mg by mouth daily., Disp: , Rfl:    metoprolol succinate (TOPROL-XL) 100 MG 24 hr tablet, Take 100 mg by mouth daily., Disp: , Rfl:    pantoprazole (PROTONIX) 40 MG tablet, Take 40 mg by mouth daily., Disp: , Rfl:    pioglitazone (ACTOS) 15 MG tablet, Take 15 mg by mouth daily., Disp: , Rfl:    Dulaglutide (TRULICITY) 1.60 FU/9.3AT SOPN, Inject into the skin., Disp: , Rfl:    Allergies  Allergen Reactions   Food     Fish sometimes    Past Medical History:  Diagnosis Date   Diabetes mellitus without complication ([place])    High cholesterol    Hypertension      History reviewed. No pertinent surgical history.  History reviewed. No pertinent family history.  Social History   Tobacco Use   Smoking status: Never   Smokeless tobacco: Never  Vaping Use   Vaping Use: Never used  Substance Use Topics   Alcohol use: Never   Drug use: Never    ROS   Objective:   Vitals: BP (!) 141/70 (BP Location: Right Arm)   Pulse 70   Temp 99 F (37.2 C) (Oral)   Resp 18   SpO2 98%   Physical Exam Constitutional:       General: She is not in acute distress.    Appearance: Normal appearance. She is well-developed. She is not ill-appearing, toxic-appearing or diaphoretic.  HENT:     Head: Normocephalic and atraumatic.     Nose: Nose normal.     Mouth/Throat:     Mouth: Mucous membranes are moist.     Pharynx: No pharyngeal swelling, oropharyngeal exudate, posterior oropharyngeal erythema or uvula swelling.     Tonsils: No tonsillar exudate or tonsillar abscesses. 0 on the right. 0 on the left.  Eyes:     General: No scleral icterus.       Right eye: No discharge.        Left eye: No discharge.     Extraocular Movements: Extraocular movements intact.  Cardiovascular:     Rate and Rhythm: Normal rate and regular rhythm.     Heart sounds: Normal heart sounds. No murmur heard.    No friction rub. No gallop.  Pulmonary:     Effort: Pulmonary effort is normal. No respiratory distress.     Breath sounds: No stridor. No wheezing, rhonchi or rales.  Chest:     Chest wall: No tenderness.  Skin:    General: Skin is warm and dry.  Neurological:     General: No  focal deficit present.     Mental Status: She is alert and oriented to person, place, and time.  Psychiatric:        Mood and Affect: Mood normal.        Behavior: Behavior normal.     Assessment and Plan :   PDMP not reviewed this encounter.  1. Acute bronchitis, unspecified organism   2. Persistent cough   3. Type 2 diabetes mellitus treated without insulin ([place])     Patient did not want to have an x-ray done.  I advised that I would be willing to treat her for bronchitis with azithromycin and a lower dose of prednisone.  Use supportive care.  If she gets any kind of worse, I emphasized that she would need to have a chest x-ray done at that stage.  She was agreeable.  Deferred COVID testing given timeline of illness. Counseled patient on potential for adverse effects with medications prescribed/recommended today, ER and return-to-clinic  precautions discussed, patient verbalized understanding.    Jaynee Eagles, PA-C 05/27/22 1325

## 2022-05-27 NOTE — ED Triage Notes (Signed)
C/O non productive cough for 2 weeks.

## 2022-05-27 NOTE — Discharge Instructions (Addendum)

## 2022-06-23 ENCOUNTER — Ambulatory Visit: Payer: Medicare HMO

## 2022-06-28 ENCOUNTER — Ambulatory Visit: Payer: Medicare HMO

## 2022-06-29 DIAGNOSIS — E559 Vitamin D deficiency, unspecified: Secondary | ICD-10-CM | POA: Diagnosis not present

## 2022-06-29 DIAGNOSIS — N1832 Chronic kidney disease, stage 3b: Secondary | ICD-10-CM | POA: Diagnosis not present

## 2022-06-30 ENCOUNTER — Ambulatory Visit
Admission: RE | Admit: 2022-06-30 | Discharge: 2022-06-30 | Disposition: A | Discharge status: Home / Self Care | Payer: Medicare HMO | Source: Ambulatory Visit | Attending: Family Medicine | Admitting: Family Medicine

## 2022-06-30 DIAGNOSIS — Z1231 Encounter for screening mammogram for malignant neoplasm of breast: Secondary | ICD-10-CM

## 2022-07-05 DIAGNOSIS — N2581 Secondary hyperparathyroidism of renal origin: Secondary | ICD-10-CM | POA: Diagnosis not present

## 2022-07-05 DIAGNOSIS — N200 Calculus of kidney: Secondary | ICD-10-CM | POA: Diagnosis not present

## 2022-07-05 DIAGNOSIS — Z905 Acquired absence of kidney: Secondary | ICD-10-CM | POA: Diagnosis not present

## 2022-07-05 DIAGNOSIS — N1832 Chronic kidney disease, stage 3b: Secondary | ICD-10-CM | POA: Diagnosis not present

## 2022-07-05 DIAGNOSIS — I129 Hypertensive chronic kidney disease with stage 1 through stage 4 chronic kidney disease, or unspecified chronic kidney disease: Secondary | ICD-10-CM | POA: Diagnosis not present

## 2022-07-05 DIAGNOSIS — R809 Proteinuria, unspecified: Secondary | ICD-10-CM | POA: Diagnosis not present

## 2022-07-05 DIAGNOSIS — E1129 Type 2 diabetes mellitus with other diabetic kidney complication: Secondary | ICD-10-CM | POA: Diagnosis not present

## 2022-07-05 DIAGNOSIS — E1122 Type 2 diabetes mellitus with diabetic chronic kidney disease: Secondary | ICD-10-CM | POA: Diagnosis not present

## 2022-07-14 ENCOUNTER — Ambulatory Visit: Payer: Medicare HMO

## 2022-07-14 DIAGNOSIS — E1122 Type 2 diabetes mellitus with diabetic chronic kidney disease: Secondary | ICD-10-CM | POA: Diagnosis not present

## 2022-07-14 DIAGNOSIS — N2581 Secondary hyperparathyroidism of renal origin: Secondary | ICD-10-CM | POA: Diagnosis not present

## 2022-07-14 DIAGNOSIS — Z79899 Other long term (current) drug therapy: Secondary | ICD-10-CM | POA: Diagnosis not present

## 2022-07-14 DIAGNOSIS — Z0000 Encounter for general adult medical examination without abnormal findings: Secondary | ICD-10-CM | POA: Diagnosis not present

## 2022-07-14 DIAGNOSIS — E2839 Other primary ovarian failure: Secondary | ICD-10-CM | POA: Diagnosis not present

## 2022-07-14 DIAGNOSIS — E785 Hyperlipidemia, unspecified: Secondary | ICD-10-CM | POA: Diagnosis not present

## 2022-07-14 DIAGNOSIS — N183 Chronic kidney disease, stage 3 unspecified: Secondary | ICD-10-CM | POA: Diagnosis not present

## 2022-07-14 DIAGNOSIS — N200 Calculus of kidney: Secondary | ICD-10-CM | POA: Diagnosis not present

## 2022-07-14 DIAGNOSIS — F320 Major depressive disorder, single episode, mild: Secondary | ICD-10-CM | POA: Diagnosis not present

## 2022-07-14 DIAGNOSIS — E559 Vitamin D deficiency, unspecified: Secondary | ICD-10-CM | POA: Diagnosis not present

## 2022-08-02 DIAGNOSIS — Z1211 Encounter for screening for malignant neoplasm of colon: Secondary | ICD-10-CM | POA: Diagnosis not present

## 2022-08-03 ENCOUNTER — Other Ambulatory Visit: Payer: Self-pay | Admitting: Family Medicine

## 2022-08-03 DIAGNOSIS — E2839 Other primary ovarian failure: Secondary | ICD-10-CM

## 2022-08-08 ENCOUNTER — Inpatient Hospital Stay: Admission: RE | Admit: 2022-08-08 | Payer: Medicare HMO | Source: Ambulatory Visit

## 2022-09-26 DIAGNOSIS — I10 Essential (primary) hypertension: Secondary | ICD-10-CM | POA: Diagnosis not present

## 2022-09-26 DIAGNOSIS — F320 Major depressive disorder, single episode, mild: Secondary | ICD-10-CM | POA: Diagnosis not present

## 2022-09-26 DIAGNOSIS — Z794 Long term (current) use of insulin: Secondary | ICD-10-CM | POA: Diagnosis not present

## 2022-09-26 DIAGNOSIS — J019 Acute sinusitis, unspecified: Secondary | ICD-10-CM | POA: Diagnosis not present

## 2022-09-26 DIAGNOSIS — E1169 Type 2 diabetes mellitus with other specified complication: Secondary | ICD-10-CM | POA: Diagnosis not present

## 2022-09-26 DIAGNOSIS — E1122 Type 2 diabetes mellitus with diabetic chronic kidney disease: Secondary | ICD-10-CM | POA: Diagnosis not present

## 2022-09-26 DIAGNOSIS — N2581 Secondary hyperparathyroidism of renal origin: Secondary | ICD-10-CM | POA: Diagnosis not present

## 2022-09-26 DIAGNOSIS — N1831 Chronic kidney disease, stage 3a: Secondary | ICD-10-CM | POA: Diagnosis not present

## 2022-12-29 DIAGNOSIS — N1832 Chronic kidney disease, stage 3b: Secondary | ICD-10-CM | POA: Diagnosis not present

## 2023-01-04 DIAGNOSIS — I129 Hypertensive chronic kidney disease with stage 1 through stage 4 chronic kidney disease, or unspecified chronic kidney disease: Secondary | ICD-10-CM | POA: Diagnosis not present

## 2023-01-04 DIAGNOSIS — E1122 Type 2 diabetes mellitus with diabetic chronic kidney disease: Secondary | ICD-10-CM | POA: Diagnosis not present

## 2023-01-04 DIAGNOSIS — N1832 Chronic kidney disease, stage 3b: Secondary | ICD-10-CM | POA: Diagnosis not present

## 2023-01-04 DIAGNOSIS — N2581 Secondary hyperparathyroidism of renal origin: Secondary | ICD-10-CM | POA: Diagnosis not present

## 2023-01-04 DIAGNOSIS — E1129 Type 2 diabetes mellitus with other diabetic kidney complication: Secondary | ICD-10-CM | POA: Diagnosis not present

## 2023-01-04 DIAGNOSIS — Z905 Acquired absence of kidney: Secondary | ICD-10-CM | POA: Diagnosis not present

## 2023-01-04 DIAGNOSIS — R809 Proteinuria, unspecified: Secondary | ICD-10-CM | POA: Diagnosis not present

## 2023-01-05 ENCOUNTER — Other Ambulatory Visit: Payer: Self-pay | Admitting: Nephrology

## 2023-01-05 DIAGNOSIS — I129 Hypertensive chronic kidney disease with stage 1 through stage 4 chronic kidney disease, or unspecified chronic kidney disease: Secondary | ICD-10-CM

## 2023-01-05 DIAGNOSIS — Z905 Acquired absence of kidney: Secondary | ICD-10-CM

## 2023-01-05 DIAGNOSIS — R809 Proteinuria, unspecified: Secondary | ICD-10-CM

## 2023-01-05 DIAGNOSIS — N200 Calculus of kidney: Secondary | ICD-10-CM

## 2023-01-05 DIAGNOSIS — N1832 Chronic kidney disease, stage 3b: Secondary | ICD-10-CM

## 2023-01-05 DIAGNOSIS — N2581 Secondary hyperparathyroidism of renal origin: Secondary | ICD-10-CM

## 2023-01-09 DIAGNOSIS — E1165 Type 2 diabetes mellitus with hyperglycemia: Secondary | ICD-10-CM | POA: Diagnosis not present

## 2023-01-09 DIAGNOSIS — E1122 Type 2 diabetes mellitus with diabetic chronic kidney disease: Secondary | ICD-10-CM | POA: Diagnosis not present

## 2023-01-09 DIAGNOSIS — N1831 Chronic kidney disease, stage 3a: Secondary | ICD-10-CM | POA: Diagnosis not present

## 2023-01-26 DIAGNOSIS — N1832 Chronic kidney disease, stage 3b: Secondary | ICD-10-CM | POA: Diagnosis not present

## 2023-01-27 ENCOUNTER — Ambulatory Visit
Admission: RE | Admit: 2023-01-27 | Discharge: 2023-01-27 | Disposition: A | Discharge status: Home / Self Care | Payer: Medicare HMO | Source: Ambulatory Visit | Attending: Family Medicine | Admitting: Family Medicine

## 2023-01-27 ENCOUNTER — Inpatient Hospital Stay: Admission: RE | Admit: 2023-01-27 | Payer: Medicare HMO | Source: Ambulatory Visit

## 2023-01-27 DIAGNOSIS — E2839 Other primary ovarian failure: Secondary | ICD-10-CM | POA: Diagnosis not present

## 2023-01-27 DIAGNOSIS — N958 Other specified menopausal and perimenopausal disorders: Secondary | ICD-10-CM | POA: Diagnosis not present

## 2023-01-27 DIAGNOSIS — E349 Endocrine disorder, unspecified: Secondary | ICD-10-CM | POA: Diagnosis not present

## 2023-01-27 DIAGNOSIS — N951 Menopausal and female climacteric states: Secondary | ICD-10-CM | POA: Diagnosis not present

## 2023-03-09 ENCOUNTER — Ambulatory Visit
Admission: RE | Admit: 2023-03-09 | Discharge: 2023-03-09 | Disposition: A | Discharge status: Home / Self Care | Payer: Medicare HMO | Source: Ambulatory Visit | Attending: Nephrology | Admitting: Nephrology

## 2023-03-09 DIAGNOSIS — E1129 Type 2 diabetes mellitus with other diabetic kidney complication: Secondary | ICD-10-CM

## 2023-03-09 DIAGNOSIS — N200 Calculus of kidney: Secondary | ICD-10-CM

## 2023-03-09 DIAGNOSIS — N1832 Chronic kidney disease, stage 3b: Secondary | ICD-10-CM

## 2023-03-09 DIAGNOSIS — Z905 Acquired absence of kidney: Secondary | ICD-10-CM

## 2023-03-09 DIAGNOSIS — I129 Hypertensive chronic kidney disease with stage 1 through stage 4 chronic kidney disease, or unspecified chronic kidney disease: Secondary | ICD-10-CM

## 2023-03-09 DIAGNOSIS — N189 Chronic kidney disease, unspecified: Secondary | ICD-10-CM | POA: Diagnosis not present

## 2023-03-09 DIAGNOSIS — N2581 Secondary hyperparathyroidism of renal origin: Secondary | ICD-10-CM

## 2023-04-12 DIAGNOSIS — E1165 Type 2 diabetes mellitus with hyperglycemia: Secondary | ICD-10-CM | POA: Diagnosis not present

## 2023-04-12 DIAGNOSIS — N1832 Chronic kidney disease, stage 3b: Secondary | ICD-10-CM | POA: Diagnosis not present

## 2023-04-12 DIAGNOSIS — N2581 Secondary hyperparathyroidism of renal origin: Secondary | ICD-10-CM | POA: Diagnosis not present

## 2023-05-03 ENCOUNTER — Other Ambulatory Visit: Payer: Self-pay | Admitting: Family Medicine

## 2023-05-03 DIAGNOSIS — Z1231 Encounter for screening mammogram for malignant neoplasm of breast: Secondary | ICD-10-CM

## 2023-05-03 DIAGNOSIS — N1832 Chronic kidney disease, stage 3b: Secondary | ICD-10-CM | POA: Diagnosis not present

## 2023-05-03 DIAGNOSIS — Z23 Encounter for immunization: Secondary | ICD-10-CM | POA: Diagnosis not present

## 2023-05-09 DIAGNOSIS — Z905 Acquired absence of kidney: Secondary | ICD-10-CM | POA: Diagnosis not present

## 2023-05-09 DIAGNOSIS — I129 Hypertensive chronic kidney disease with stage 1 through stage 4 chronic kidney disease, or unspecified chronic kidney disease: Secondary | ICD-10-CM | POA: Diagnosis not present

## 2023-05-09 DIAGNOSIS — E1122 Type 2 diabetes mellitus with diabetic chronic kidney disease: Secondary | ICD-10-CM | POA: Diagnosis not present

## 2023-05-09 DIAGNOSIS — N2581 Secondary hyperparathyroidism of renal origin: Secondary | ICD-10-CM | POA: Diagnosis not present

## 2023-05-09 DIAGNOSIS — N200 Calculus of kidney: Secondary | ICD-10-CM | POA: Diagnosis not present

## 2023-05-09 DIAGNOSIS — N1832 Chronic kidney disease, stage 3b: Secondary | ICD-10-CM | POA: Diagnosis not present

## 2023-05-09 DIAGNOSIS — R809 Proteinuria, unspecified: Secondary | ICD-10-CM | POA: Diagnosis not present

## 2023-07-03 ENCOUNTER — Ambulatory Visit: Payer: Medicare HMO

## 2023-07-11 DIAGNOSIS — H26493 Other secondary cataract, bilateral: Secondary | ICD-10-CM | POA: Diagnosis not present

## 2023-07-11 DIAGNOSIS — D23111 Other benign neoplasm of skin of right upper eyelid, including canthus: Secondary | ICD-10-CM | POA: Diagnosis not present

## 2023-07-11 DIAGNOSIS — H21233 Degeneration of iris (pigmentary), bilateral: Secondary | ICD-10-CM | POA: Diagnosis not present

## 2023-07-11 DIAGNOSIS — E119 Type 2 diabetes mellitus without complications: Secondary | ICD-10-CM | POA: Diagnosis not present

## 2023-07-14 DIAGNOSIS — E1165 Type 2 diabetes mellitus with hyperglycemia: Secondary | ICD-10-CM | POA: Diagnosis not present

## 2023-07-14 DIAGNOSIS — Z79899 Other long term (current) drug therapy: Secondary | ICD-10-CM | POA: Diagnosis not present

## 2023-07-14 DIAGNOSIS — I10 Essential (primary) hypertension: Secondary | ICD-10-CM | POA: Diagnosis not present

## 2023-07-14 DIAGNOSIS — E1122 Type 2 diabetes mellitus with diabetic chronic kidney disease: Secondary | ICD-10-CM | POA: Diagnosis not present

## 2023-07-14 DIAGNOSIS — N184 Chronic kidney disease, stage 4 (severe): Secondary | ICD-10-CM | POA: Diagnosis not present

## 2023-07-14 DIAGNOSIS — E785 Hyperlipidemia, unspecified: Secondary | ICD-10-CM | POA: Diagnosis not present

## 2023-07-17 DIAGNOSIS — E1122 Type 2 diabetes mellitus with diabetic chronic kidney disease: Secondary | ICD-10-CM | POA: Diagnosis not present

## 2023-07-17 DIAGNOSIS — E1165 Type 2 diabetes mellitus with hyperglycemia: Secondary | ICD-10-CM | POA: Diagnosis not present

## 2023-07-17 DIAGNOSIS — Z79899 Other long term (current) drug therapy: Secondary | ICD-10-CM | POA: Diagnosis not present

## 2023-07-17 DIAGNOSIS — I10 Essential (primary) hypertension: Secondary | ICD-10-CM | POA: Diagnosis not present

## 2023-07-17 DIAGNOSIS — E785 Hyperlipidemia, unspecified: Secondary | ICD-10-CM | POA: Diagnosis not present

## 2023-07-17 DIAGNOSIS — Z0000 Encounter for general adult medical examination without abnormal findings: Secondary | ICD-10-CM | POA: Diagnosis not present

## 2023-07-17 DIAGNOSIS — N184 Chronic kidney disease, stage 4 (severe): Secondary | ICD-10-CM | POA: Diagnosis not present

## 2023-07-25 DIAGNOSIS — Z961 Presence of intraocular lens: Secondary | ICD-10-CM | POA: Diagnosis not present

## 2023-07-25 DIAGNOSIS — H26493 Other secondary cataract, bilateral: Secondary | ICD-10-CM | POA: Diagnosis not present

## 2023-08-04 ENCOUNTER — Ambulatory Visit: Payer: Medicare HMO

## 2023-08-04 ENCOUNTER — Ambulatory Visit
Admission: RE | Admit: 2023-08-04 | Discharge: 2023-08-04 | Disposition: A | Discharge status: Home / Self Care | Payer: Medicare HMO | Source: Ambulatory Visit | Attending: Family Medicine | Admitting: Family Medicine

## 2023-08-04 DIAGNOSIS — Z1231 Encounter for screening mammogram for malignant neoplasm of breast: Secondary | ICD-10-CM

## 2023-08-29 DIAGNOSIS — H26491 Other secondary cataract, right eye: Secondary | ICD-10-CM | POA: Diagnosis not present

## 2023-09-05 DIAGNOSIS — N1832 Chronic kidney disease, stage 3b: Secondary | ICD-10-CM | POA: Diagnosis not present

## 2023-09-13 DIAGNOSIS — Z905 Acquired absence of kidney: Secondary | ICD-10-CM | POA: Diagnosis not present

## 2023-09-13 DIAGNOSIS — E1122 Type 2 diabetes mellitus with diabetic chronic kidney disease: Secondary | ICD-10-CM | POA: Diagnosis not present

## 2023-09-13 DIAGNOSIS — N200 Calculus of kidney: Secondary | ICD-10-CM | POA: Diagnosis not present

## 2023-09-13 DIAGNOSIS — R809 Proteinuria, unspecified: Secondary | ICD-10-CM | POA: Diagnosis not present

## 2023-09-13 DIAGNOSIS — E1129 Type 2 diabetes mellitus with other diabetic kidney complication: Secondary | ICD-10-CM | POA: Diagnosis not present

## 2023-09-13 DIAGNOSIS — N184 Chronic kidney disease, stage 4 (severe): Secondary | ICD-10-CM | POA: Diagnosis not present

## 2023-09-13 DIAGNOSIS — N2581 Secondary hyperparathyroidism of renal origin: Secondary | ICD-10-CM | POA: Diagnosis not present

## 2023-09-13 DIAGNOSIS — I129 Hypertensive chronic kidney disease with stage 1 through stage 4 chronic kidney disease, or unspecified chronic kidney disease: Secondary | ICD-10-CM | POA: Diagnosis not present

## 2023-10-05 DIAGNOSIS — Z961 Presence of intraocular lens: Secondary | ICD-10-CM | POA: Diagnosis not present

## 2023-10-05 DIAGNOSIS — H524 Presbyopia: Secondary | ICD-10-CM | POA: Diagnosis not present

## 2023-11-14 DIAGNOSIS — E1165 Type 2 diabetes mellitus with hyperglycemia: Secondary | ICD-10-CM | POA: Diagnosis not present

## 2023-11-14 DIAGNOSIS — E785 Hyperlipidemia, unspecified: Secondary | ICD-10-CM | POA: Diagnosis not present

## 2023-11-14 DIAGNOSIS — N1831 Chronic kidney disease, stage 3a: Secondary | ICD-10-CM | POA: Diagnosis not present

## 2024-01-16 DIAGNOSIS — N184 Chronic kidney disease, stage 4 (severe): Secondary | ICD-10-CM | POA: Diagnosis not present

## 2024-01-24 DIAGNOSIS — I129 Hypertensive chronic kidney disease with stage 1 through stage 4 chronic kidney disease, or unspecified chronic kidney disease: Secondary | ICD-10-CM | POA: Diagnosis not present

## 2024-01-24 DIAGNOSIS — E1122 Type 2 diabetes mellitus with diabetic chronic kidney disease: Secondary | ICD-10-CM | POA: Diagnosis not present

## 2024-01-24 DIAGNOSIS — Z905 Acquired absence of kidney: Secondary | ICD-10-CM | POA: Diagnosis not present

## 2024-01-24 DIAGNOSIS — E1129 Type 2 diabetes mellitus with other diabetic kidney complication: Secondary | ICD-10-CM | POA: Diagnosis not present

## 2024-01-24 DIAGNOSIS — N200 Calculus of kidney: Secondary | ICD-10-CM | POA: Diagnosis not present

## 2024-01-24 DIAGNOSIS — N184 Chronic kidney disease, stage 4 (severe): Secondary | ICD-10-CM | POA: Diagnosis not present

## 2024-01-24 DIAGNOSIS — N2581 Secondary hyperparathyroidism of renal origin: Secondary | ICD-10-CM | POA: Diagnosis not present

## 2024-01-24 DIAGNOSIS — R809 Proteinuria, unspecified: Secondary | ICD-10-CM | POA: Diagnosis not present

## 2024-02-26 DIAGNOSIS — E785 Hyperlipidemia, unspecified: Secondary | ICD-10-CM | POA: Diagnosis not present

## 2024-02-26 DIAGNOSIS — I10 Essential (primary) hypertension: Secondary | ICD-10-CM | POA: Diagnosis not present

## 2024-02-26 DIAGNOSIS — N1831 Chronic kidney disease, stage 3a: Secondary | ICD-10-CM | POA: Diagnosis not present

## 2024-03-28 DIAGNOSIS — E785 Hyperlipidemia, unspecified: Secondary | ICD-10-CM | POA: Diagnosis not present

## 2024-03-28 DIAGNOSIS — N1831 Chronic kidney disease, stage 3a: Secondary | ICD-10-CM | POA: Diagnosis not present

## 2024-03-28 DIAGNOSIS — I10 Essential (primary) hypertension: Secondary | ICD-10-CM | POA: Diagnosis not present

## 2024-05-07 DIAGNOSIS — Z23 Encounter for immunization: Secondary | ICD-10-CM | POA: Diagnosis not present

## 2024-05-22 DIAGNOSIS — R809 Proteinuria, unspecified: Secondary | ICD-10-CM | POA: Diagnosis not present

## 2024-05-22 DIAGNOSIS — N184 Chronic kidney disease, stage 4 (severe): Secondary | ICD-10-CM | POA: Diagnosis not present

## 2024-05-22 DIAGNOSIS — Z905 Acquired absence of kidney: Secondary | ICD-10-CM | POA: Diagnosis not present

## 2024-05-22 DIAGNOSIS — E1122 Type 2 diabetes mellitus with diabetic chronic kidney disease: Secondary | ICD-10-CM | POA: Diagnosis not present

## 2024-05-22 DIAGNOSIS — N2581 Secondary hyperparathyroidism of renal origin: Secondary | ICD-10-CM | POA: Diagnosis not present

## 2024-05-22 DIAGNOSIS — I129 Hypertensive chronic kidney disease with stage 1 through stage 4 chronic kidney disease, or unspecified chronic kidney disease: Secondary | ICD-10-CM | POA: Diagnosis not present

## 2024-05-22 DIAGNOSIS — N200 Calculus of kidney: Secondary | ICD-10-CM | POA: Diagnosis not present

## 2024-05-28 DIAGNOSIS — N1831 Chronic kidney disease, stage 3a: Secondary | ICD-10-CM | POA: Diagnosis not present

## 2024-05-28 DIAGNOSIS — E785 Hyperlipidemia, unspecified: Secondary | ICD-10-CM | POA: Diagnosis not present

## 2024-05-28 DIAGNOSIS — I10 Essential (primary) hypertension: Secondary | ICD-10-CM | POA: Diagnosis not present

## 2024-07-04 ENCOUNTER — Other Ambulatory Visit: Payer: Self-pay | Admitting: Family Medicine

## 2024-07-04 DIAGNOSIS — Z1231 Encounter for screening mammogram for malignant neoplasm of breast: Secondary | ICD-10-CM

## 2024-08-06 ENCOUNTER — Ambulatory Visit

## 2024-09-03 ENCOUNTER — Other Ambulatory Visit: Payer: Self-pay | Admitting: Family Medicine

## 2024-09-03 ENCOUNTER — Ambulatory Visit
Admission: RE | Admit: 2024-09-03 | Discharge: 2024-09-03 | Disposition: A | Source: Ambulatory Visit | Attending: Family Medicine | Admitting: Family Medicine

## 2024-09-03 DIAGNOSIS — Z1231 Encounter for screening mammogram for malignant neoplasm of breast: Secondary | ICD-10-CM
